# Patient Record
Sex: Female | Born: 1980 | Race: White | Hispanic: No | Marital: Married | Smoking: Never smoker
Health system: Southern US, Community
[De-identification: ages and names within clinical notes are randomized; demographics above are authoritative.]

## PROBLEM LIST (undated history)

## (undated) DIAGNOSIS — E8881 Metabolic syndrome: Secondary | ICD-10-CM

## (undated) DIAGNOSIS — E282 Polycystic ovarian syndrome: Secondary | ICD-10-CM

## (undated) DIAGNOSIS — E88819 Insulin resistance, unspecified: Secondary | ICD-10-CM

## (undated) HISTORY — PX: TONSILLECTOMY: SUR1361

---

## 2017-10-13 ENCOUNTER — Other Ambulatory Visit: Payer: Self-pay

## 2017-10-13 ENCOUNTER — Emergency Department (HOSPITAL_BASED_OUTPATIENT_CLINIC_OR_DEPARTMENT_OTHER)
Admission: EM | Admit: 2017-10-13 | Discharge: 2017-10-13 | Disposition: A | Discharge status: Home / Self Care | Payer: BC Managed Care – PPO | Attending: Emergency Medicine | Admitting: Emergency Medicine

## 2017-10-13 ENCOUNTER — Encounter (HOSPITAL_BASED_OUTPATIENT_CLINIC_OR_DEPARTMENT_OTHER): Payer: Self-pay

## 2017-10-13 ENCOUNTER — Emergency Department (HOSPITAL_BASED_OUTPATIENT_CLINIC_OR_DEPARTMENT_OTHER): Payer: BC Managed Care – PPO

## 2017-10-13 DIAGNOSIS — R142 Eructation: Secondary | ICD-10-CM | POA: Insufficient documentation

## 2017-10-13 DIAGNOSIS — R0789 Other chest pain: Secondary | ICD-10-CM | POA: Diagnosis present

## 2017-10-13 HISTORY — DX: Metabolic syndrome: E88.81

## 2017-10-13 HISTORY — DX: Polycystic ovarian syndrome: E28.2

## 2017-10-13 HISTORY — DX: Insulin resistance, unspecified: E88.819

## 2017-10-13 LAB — COMPREHENSIVE METABOLIC PANEL
ALT: 23 U/L (ref 14–54)
AST: 27 U/L (ref 15–41)
Albumin: 4.2 g/dL (ref 3.5–5.0)
Alkaline Phosphatase: 69 U/L (ref 38–126)
Anion gap: 7 (ref 5–15)
BUN: 15 mg/dL (ref 6–20)
CO2: 21 mmol/L — ABNORMAL LOW (ref 22–32)
Calcium: 9 mg/dL (ref 8.9–10.3)
Chloride: 106 mmol/L (ref 101–111)
Creatinine, Ser: 0.68 mg/dL (ref 0.44–1.00)
GFR calc Af Amer: >60 mL/min (ref >60)
GFR calc non Af Amer: >60 mL/min (ref >60)
Glucose, Bld: 95 mg/dL (ref 65–99)
Potassium: 3.8 mmol/L (ref 3.5–5.1)
Sodium: 134 mmol/L — ABNORMAL LOW (ref 135–145)
Total Bilirubin: 0.3 mg/dL (ref 0.3–1.2)
Total Protein: 8 g/dL (ref 6.5–8.1)

## 2017-10-13 LAB — TROPONIN I: Troponin I: <0.03 ng/mL (ref <0.03)

## 2017-10-13 LAB — D-DIMER, QUANTITATIVE (NOT AT ARMC): D-Dimer, Quant: <0.27 ug/mL-FEU (ref 0.00–0.50)

## 2017-10-13 LAB — CBC
HEMATOCRIT: 32.3 % — AB (ref 36.0–46.0)
Hemoglobin: 10.7 g/dL — ABNORMAL LOW (ref 12.0–15.0)
MCH: 20.7 pg — AB (ref 26.0–34.0)
MCHC: 33.1 g/dL (ref 30.0–36.0)
MCV: 62.6 fL — ABNORMAL LOW (ref 78.0–100.0)
Platelets: 300 10*3/uL (ref 150–400)
RBC: 5.16 MIL/uL — ABNORMAL HIGH (ref 3.87–5.11)
RDW: 17.4 % — ABNORMAL HIGH (ref 11.5–15.5)
WBC: 10.5 10*3/uL (ref 4.0–10.5)

## 2017-10-13 LAB — LIPASE, BLOOD: Lipase: 34 U/L (ref 11–51)

## 2017-10-13 LAB — PREGNANCY, URINE: PREG TEST UR: NEGATIVE

## 2017-10-13 MED ORDER — KETOROLAC TROMETHAMINE 30 MG/ML IJ SOLN
30.0000 mg | Freq: Once | INTRAMUSCULAR | Status: AC
  Administered 2017-10-13: 30 mg via INTRAVENOUS
  Filled 2017-10-13: qty 1

## 2017-10-13 MED ORDER — PANTOPRAZOLE SODIUM 40 MG IV SOLR
40.0000 mg | Freq: Once | INTRAVENOUS | Status: AC
  Administered 2017-10-13: 40 mg via INTRAVENOUS
  Filled 2017-10-13: qty 40

## 2017-10-13 MED ORDER — ONDANSETRON HCL 4 MG PO TABS: 4.0000 mg | ORAL_TABLET | Freq: Four times a day (QID) | ORAL | 0 refills | Status: AC

## 2017-10-13 MED ORDER — SIMETHICONE 40 MG/0.6ML PO SUSP (UNIT DOSE)
40.0000 mg | Freq: Once | ORAL | Status: AC
  Administered 2017-10-13: 40 mg via ORAL
  Filled 2017-10-13: qty 0.6

## 2017-10-13 MED ORDER — GI COCKTAIL ~~LOC~~
30.0000 mL | Freq: Once | ORAL | Status: AC
  Administered 2017-10-13: 30 mL via ORAL
  Filled 2017-10-13: qty 30

## 2017-10-13 MED ORDER — PANTOPRAZOLE SODIUM 20 MG PO TBEC: 20.0000 mg | DELAYED_RELEASE_TABLET | Freq: Every day | ORAL | 0 refills | Status: AC

## 2017-10-13 NOTE — ED Triage Notes (Signed)
C/o CP since 9am-NAD-steady gait

## 2017-10-13 NOTE — ED Notes (Signed)
Pt continues to have frequent belching and reports that her symptoms remain unchanged

## 2017-10-13 NOTE — ED Provider Notes (Signed)
[facility] EMERGENCY DEPARTMENT Provider Note   CSN: [id number] Arrival date & time: 10/13/17  1525     History   Chief Complaint Chief Complaint  Patient presents with  . Chest Pain    HPI Rebekah Mckee is a 37 y.o. female with history of PCOS who presents with a 1 day history of chest pain that began this morning after drinking a protein shake.  Patient reports she stood up and got a sharp pain in the center of her chest rating through to her back.  She reports having left-sided jaw and neck pain over the weekend, but none at this time.  She has had pain rating to her left arm.  She has had associated nausea, but no vomiting.  Her pain is pleuritic.  She has had associated shortness of breath.  It is not exertional.  It is constant.  She reports she has been belching more than usual today.  She reports she used to take medicine for acid reflux in 2008-2009, but nothing recently.  She reports she just had a cough for a week, however this is resolved.  She denies any recent long trips, surgeries, known cancer, exogenous estrogen use, new leg pain or swelling, history of blood clots.  She reports her father died of stage IV colon cancer.  She reports having a negative colonoscopy 10 years ago and was supposed to follow-up 5 years ago, however has not.  She reports having intermittent lower abdominal pain for the past  few years.  She saw her OB/GYN and had a negative workup there, however she has not followed up with a GI specialist. She is currently doing a detox with pills and shakes that she thinks is mostly natural and she has lost 14 pounds in 2 weeks because of it.  HPI  Past Medical History:  Diagnosis Date  . Insulin resistance   . PCOS (polycystic ovarian syndrome)     There are no active problems to display for this patient.   Past Surgical History:  Procedure Laterality Date  . TONSILLECTOMY       OB History   None      Home Medications    Prior to  Admission medications   Medication Sig Start Date End Date Taking? Authorizing Provider  ondansetron (ZOFRAN) 4 MG tablet Take 1 tablet (4 mg total) by mouth every 6 (six) hours. 10/13/17   Wilmont Olund, Waylan Boga, PA-C  pantoprazole (PROTONIX) 20 MG tablet Take 1 tablet (20 mg total) by mouth daily. 10/13/17   Emi Holes, PA-C    Family History No family history on file.  Social History Social History   Tobacco Use  . Smoking status: Never Smoker  . Smokeless tobacco: Never Used  Substance Use Topics  . Alcohol use: Not Currently  . Drug use: Never     Allergies   Patient has no known allergies.   Review of Systems Review of Systems  Constitutional: Positive for diaphoresis. Negative for chills and fever.  HENT: Negative for facial swelling and sore throat.   Respiratory: Positive for shortness of breath.   Cardiovascular: Positive for chest pain.  Gastrointestinal: Positive for abdominal pain and nausea. Negative for constipation, diarrhea and vomiting.  Genitourinary: Negative for dysuria.  Musculoskeletal: Positive for back pain and neck pain.  Skin: Negative for rash and wound.  Neurological: Negative for headaches.  Psychiatric/Behavioral: The patient is not nervous/anxious.      Physical Exam Updated Vital Signs BP 131/84 (BP  Location: Right Arm)   Pulse 62   Temp 97.7 F (36.5 C) (Oral)   Resp 16   Ht 5\' 8"  (1.727 m)   Wt 119 kg (262 lb 5.6 oz)   SpO2 99%   BMI 39.89 kg/m   Physical Exam  Constitutional: She appears well-developed and well-nourished. No distress.  HENT:  Head: Normocephalic and atraumatic.  Mouth/Throat: Oropharynx is clear and moist. No oropharyngeal exudate.  Eyes: Pupils are equal, round, and reactive to light. Conjunctivae are normal. Right eye exhibits no discharge. Left eye exhibits no discharge. No scleral icterus.  Neck: Normal range of motion. Neck supple. No thyromegaly present.  Cardiovascular: Normal rate, regular rhythm,  normal heart sounds and intact distal pulses. Exam reveals no gallop and no friction rub.  No murmur heard. Pulmonary/Chest: Effort normal and breath sounds normal. No stridor. No respiratory distress. She has no wheezes. She has no rales. She exhibits tenderness.  Patient wincing in pain while breathing    Abdominal: Soft. Bowel sounds are normal. She exhibits no distension. There is tenderness in the epigastric area and left upper quadrant. There is no rebound, no guarding, no tenderness at McBurney's point and negative Murphy's sign.  Musculoskeletal: She exhibits no edema.  Lymphadenopathy:    She has no cervical adenopathy.  Neurological: She is alert. Coordination normal.  Skin: Skin is warm and dry. No rash noted. She is not diaphoretic. No pallor.  Psychiatric: She has a normal mood and affect.  Nursing note and vitals reviewed.    ED Treatments / Results  Labs (all labs ordered are listed, but only abnormal results are displayed) Labs Reviewed  CBC - Abnormal; Notable for the following components:      Result Value   RBC 5.16 (*)    Hemoglobin 10.7 (*)    HCT 32.3 (*)    MCV 62.6 (*)    MCH 20.7 (*)    RDW 17.4 (*)    All other components within normal limits  COMPREHENSIVE METABOLIC PANEL - Abnormal; Notable for the following components:   Sodium 134 (*)    CO2 21 (*)    All other components within normal limits  TROPONIN I  PREGNANCY, URINE  LIPASE, BLOOD  D-DIMER, QUANTITATIVE (NOT AT [facility])    EKG EKG Interpretation  Date/Time:  Tuesday October 13 2017 15:36:10 EDT Ventricular Rate:  64 PR Interval:  128 QRS Duration: 100 QT Interval:  414 QTC Calculation: 427 R Axis:   39 Text Interpretation:  Normal sinus rhythm Normal ECG No previous tracing to compare with Confirmed by Meridee ScoreButler, Michael [phone]([id number]) on 10/13/2017 3:40:25 PM   Radiology Dg Chest 2 View  Result Date: 10/13/2017 CLINICAL DATA:  Chest pain EXAM: CHEST - 2 VIEW COMPARISON:  None. FINDINGS: The  heart size and mediastinal contours are within normal limits. Both lungs are clear. The visualized skeletal structures are unremarkable. IMPRESSION: No active cardiopulmonary disease. Electronically Signed   By: Marlan Palauharles  Clark M.D.   On: 10/13/2017 15:54    Procedures Procedures (including critical care time)  Medications Ordered in ED Medications  gi cocktail (Maalox,Lidocaine,Donnatal) (30 mLs Oral Given 10/13/17 1638)  pantoprazole (PROTONIX) injection 40 mg (40 mg Intravenous Given 10/13/17 1819)  ketorolac (TORADOL) 30 MG/ML injection 30 mg (30 mg Intravenous Given 10/13/17 1918)  simethicone (MYLICON) 40 mg/0.606ml suspension 40 mg (40 mg Oral Given 10/13/17 1940)     Initial Impression / Assessment and Plan / ED Course  I have reviewed the triage vital signs  and the nursing notes.  Pertinent labs & imaging results that were available during my care of the patient were reviewed by me and considered in my medical decision making (see chart for details).  Clinical Course as of Oct 15 27  Tue Oct 13, 2017  1620 Patient does have significant pleuritic pain and shortness of breath, which is concerning for PE, will add d-dimer.  I discussed this with the patient and she is agreeable and would like to rule this out.   [AL]  1821 On reevaluation, patient not improved after GI cocktail.  Patient did drive herself here and hopes to drive herself home.  She continues to have belching.  Will give Protonix and reassess.    [AL]  1913 After Protonix, patient reports no significant change in her chest pain, however she has been belching more frequently.  She has had some gas type pain extending to her abdomen.  No focal abdominal tenderness on repeat exam.   [AL]  7316 38 year old female complaining of upper chest discomfort all associated with some belching.  She has been doing a cleansing diet over the past couple weeks and her symptoms acutely happen today after she drank a shake.  She has had problems  with gas pockets and other GI issues in the past.  She is continuing to belch here.  Her cardiac workup including a d-dimer been unremarkable.  Likely this is all GI in nature and she will need to follow-up with her doctor.   [MB]    Clinical Course User Index [AL] Emi Holes, PA-C [MB] Terrilee Files, MD    Patient with chest pain with belching. CBC shows hemoglobin 10.7. 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 She reports some intermittent blood in her stool, but would like to follow up with her doctor for further workup of this. I offered rectal exam with stool card and she declined today. CMP, lipase unremarkable. Troponin and D-dimer negative. No delta trop ordered considering constant pain since 9am this morning. CXR negative. Upreg negative. EKG shows NSR. Patient does feel some improvement after Mylicon and Toradol, but felt no improvement with Protonix or gi cocktail. Patient wanted to drive herself home. Considering family history of colon cancer, patient advised to follow up for her colonoscopy and further evaluation with GI. No focal abdominal pain or tenderness indicating further imaging today. I advised patient may want to stop the detox she is doing as it may be contributing to her symptoms. Will discharge home with protonix and Zofran. Return precautions discussed. Patient understands and agrees with plan. Patient vitals stable throughout ED course and discharged in satisfactory condition. Patient also evaluated by Dr. Charm Barges who guided the patient's management and agrees with plan.  Final Clinical Impressions(s) / ED Diagnoses   Final diagnoses:  Atypical chest pain  Belching    ED Discharge Orders        Ordered    ondansetron (ZOFRAN) 4 MG tablet  Every 6 hours     10/13/17 2011    pantoprazole (PROTONIX) 20 MG tablet  Daily     10/13/17 2011       Undrea Archbold M, [place] 10/14/17 0031    Terrilee Files, MD 10/14/17 1148

## 2017-10-13 NOTE — Discharge Instructions (Addendum)
Medications: Zofran, Protonix  Treatment: Take Zofran every 6 hours as needed for nausea or vomiting. Take Protonix once daily as prescribed.  Follow-up: Please follow-up with your [facility] care provider and [facility], for further evaluation and treatment of your symptoms and colonoscopy. Please return to the emergency department if you develop any new or worsening symptoms.

## 2017-11-21 ENCOUNTER — Emergency Department (HOSPITAL_BASED_OUTPATIENT_CLINIC_OR_DEPARTMENT_OTHER)
Admission: EM | Admit: 2017-11-21 | Discharge: 2017-11-21 | Disposition: A | Discharge status: Home / Self Care | Payer: BC Managed Care – PPO | Attending: Emergency Medicine | Admitting: Emergency Medicine

## 2017-11-21 ENCOUNTER — Encounter (HOSPITAL_BASED_OUTPATIENT_CLINIC_OR_DEPARTMENT_OTHER): Payer: Self-pay | Admitting: Emergency Medicine

## 2017-11-21 ENCOUNTER — Other Ambulatory Visit: Payer: Self-pay

## 2017-11-21 DIAGNOSIS — R21 Rash and other nonspecific skin eruption: Secondary | ICD-10-CM | POA: Diagnosis present

## 2017-11-21 DIAGNOSIS — Z79899 Other long term (current) drug therapy: Secondary | ICD-10-CM | POA: Insufficient documentation

## 2017-11-21 DIAGNOSIS — L509 Urticaria, unspecified: Secondary | ICD-10-CM | POA: Insufficient documentation

## 2017-11-21 LAB — PREGNANCY, URINE: PREG TEST UR: NEGATIVE

## 2017-11-21 MED ORDER — PREDNISONE 20 MG PO TABS: 40.0000 mg | ORAL_TABLET | Freq: Every day | ORAL | 0 refills | Status: AC

## 2017-11-21 MED ORDER — CETIRIZINE HCL 10 MG PO TABS: 10.0000 mg | ORAL_TABLET | Freq: Every day | ORAL | 0 refills | Status: AC

## 2017-11-21 MED ORDER — PREDNISONE 50 MG PO TABS
60.0000 mg | ORAL_TABLET | Freq: Once | ORAL | Status: AC
  Administered 2017-11-21: 09:00:00 60 mg via ORAL
  Filled 2017-11-21: qty 1

## 2017-11-21 NOTE — ED Triage Notes (Signed)
Has had a intermittent rash x 1 week , itchy.

## 2017-11-21 NOTE — ED Provider Notes (Signed)
[facility] EMERGENCY DEPARTMENT Provider Note   CSN: [id number] Arrival date & time: 11/21/17  0753     History   Chief Complaint Chief Complaint  Patient presents with  . Rash    HPI Rebekah Mckee is a 37 y.o. female.  Patient is a 37 year old female with a history of PCOS but takes no medications at this time presenting with 1 week history of fluctuating rash, itchiness that is gradually worsening.  She states last Sunday she went on a drive with her husband and when she got back she broke out in a rash that burns and itches.  She states the rash will come and go and pops up over different areas of her body.  When it is bad her skin whelps up but then goes away on its own.  In the last few days she started to have some intermittent diarrhea and this morning she felt like her throat was itchy and thick.  She cannot think of any new exposures but does have allergies to pollen.  She was looking online and started using Benadryl and Pepcid but it caused her to have a headache and did not improve her symptoms.  She is never had anything like this before.  She also states she has been more tired lately and having some breast tenderness.  She took a pregnancy test but it was negative.  She has irregular periods and last menses was in September.  She denies any history of tick bites in the last month.  Her dogs usefully and take medication and she has not been in a heavily wooded area.  The history is provided by the patient.  Rash   This is a new problem. Episode onset: 1 week ago. The problem has been gradually worsening. The problem is associated with an unknown factor. There has been no fever. Affected Location: diffuse and moves. The pain is at a severity of 0/10. The patient is experiencing no pain. Associated symptoms include itching. Treatments tried: benadryl and pepcid. The treatment provided no relief. Risk factors: for last 2 months using an herbal medication.    Past  Medical History:  Diagnosis Date  . Insulin resistance   . PCOS (polycystic ovarian syndrome)     There are no active problems to display for this patient.   Past Surgical History:  Procedure Laterality Date  . TONSILLECTOMY       OB History   None      Home Medications    Prior to Admission medications   Medication Sig Start Date End Date Taking? Authorizing Provider  ondansetron (ZOFRAN) 4 MG tablet Take 1 tablet (4 mg total) by mouth every 6 (six) hours. 10/13/17  Yes Law, Waylan Boga, PA-C  pantoprazole (PROTONIX) 20 MG tablet Take 1 tablet (20 mg total) by mouth daily. 10/13/17  Yes Law, Waylan Boga, PA-C    Family History No family history on file.  Social History Social History   Tobacco Use  . Smoking status: Never Smoker  . Smokeless tobacco: Never Used  Substance Use Topics  . Alcohol use: Not Currently  . Drug use: Never     Allergies   Patient has no known allergies.   Review of Systems Review of Systems  Skin: Positive for itching and rash.  All other systems reviewed and are negative.    Physical Exam Updated Vital Signs BP 116/81 (BP Location: Left Arm)   Pulse 86   Temp 99.1 F (37.3 C) (Oral)  Resp 16   Ht  (1.727 m)   Wt 118.4 kg (261 lb)   SpO2 98%   BMI 39.68 kg/m   Physical Exam  Constitutional: She is oriented to person, place, and time. She appears well-developed and well-nourished. No distress.  HENT:  Head: Normocephalic and atraumatic.  Mouth/Throat: Oropharynx is clear and moist.  No uvular or tongue edema  Eyes: Pupils are equal, round, and reactive to light. Conjunctivae and EOM are normal.  Neck: Normal range of motion. Neck supple.  No stridor  Cardiovascular: Normal rate, regular rhythm and intact distal pulses.  No murmur heard. Pulmonary/Chest: Effort normal and breath sounds normal. No respiratory distress. She has no wheezes. She has no rales.  Abdominal: Soft. She exhibits no distension. There is no  tenderness. There is no rebound and no guarding.  Musculoskeletal: Normal range of motion. She exhibits no edema or tenderness.  Neurological: She is alert and oriented to person, place, and time.  Skin: Skin is warm and dry. Rash noted. No erythema.  Small patches of a faint macular rash without petechia or vesicles.  Area on her left arm, left upper thigh but very faint and resolving.  Fine macular/papular rash over the face under her eyes  Psychiatric: She has a normal mood and affect. Her behavior is normal.  Nursing note and vitals reviewed.    ED Treatments / Results  Labs (all labs ordered are listed, but only abnormal results are displayed) Labs Reviewed  PREGNANCY, URINE    EKG None  Radiology No results found.  Procedures Procedures (including critical care time)  Medications Ordered in ED Medications - No data to display   Initial Impression / Assessment and Plan / ED Course  I have reviewed the triage vital signs and the nursing notes.  Pertinent labs & imaging results that were available during my care of the patient were reviewed by me and considered in my medical decision making (see chart for details).     Patient presenting with symptoms concerning for urticaria versus an allergic reaction.  Patient is well-appearing here and in no acute distress.  She has been taking a new herbal medication for the last few months but other than that no other medications.  No infectious symptoms such as fever, cough or shortness of breath.  Low suspicion that this is [facility] spotted fever as patient has no neck stiffness.  She will occasionally get a headache but it is not chronic over the last week. UPT is negative.  Will start patient on prednisone and Zyrtec.  Recommended follow-up with an allergist if symptoms do not improve.  Final Clinical Impressions(s) / ED Diagnoses   Final diagnoses:  Urticaria    ED Discharge Orders        Ordered    predniSONE  (DELTASONE) 20 MG tablet  Daily     11/21/17 0855    cetirizine (ZYRTEC ALLERGY) 10 MG tablet  Daily     11/21/17 0855       Gwyneth Sprout, MD 11/21/17 [phone]

## 2018-11-10 IMAGING — CR DG CHEST 2V
2 series · 2 of 2 positions shown · non-contrast
Comparison: None.

CLINICAL DATA: Chest pain

EXAM:
CHEST - 2 VIEW

[w chest pa]
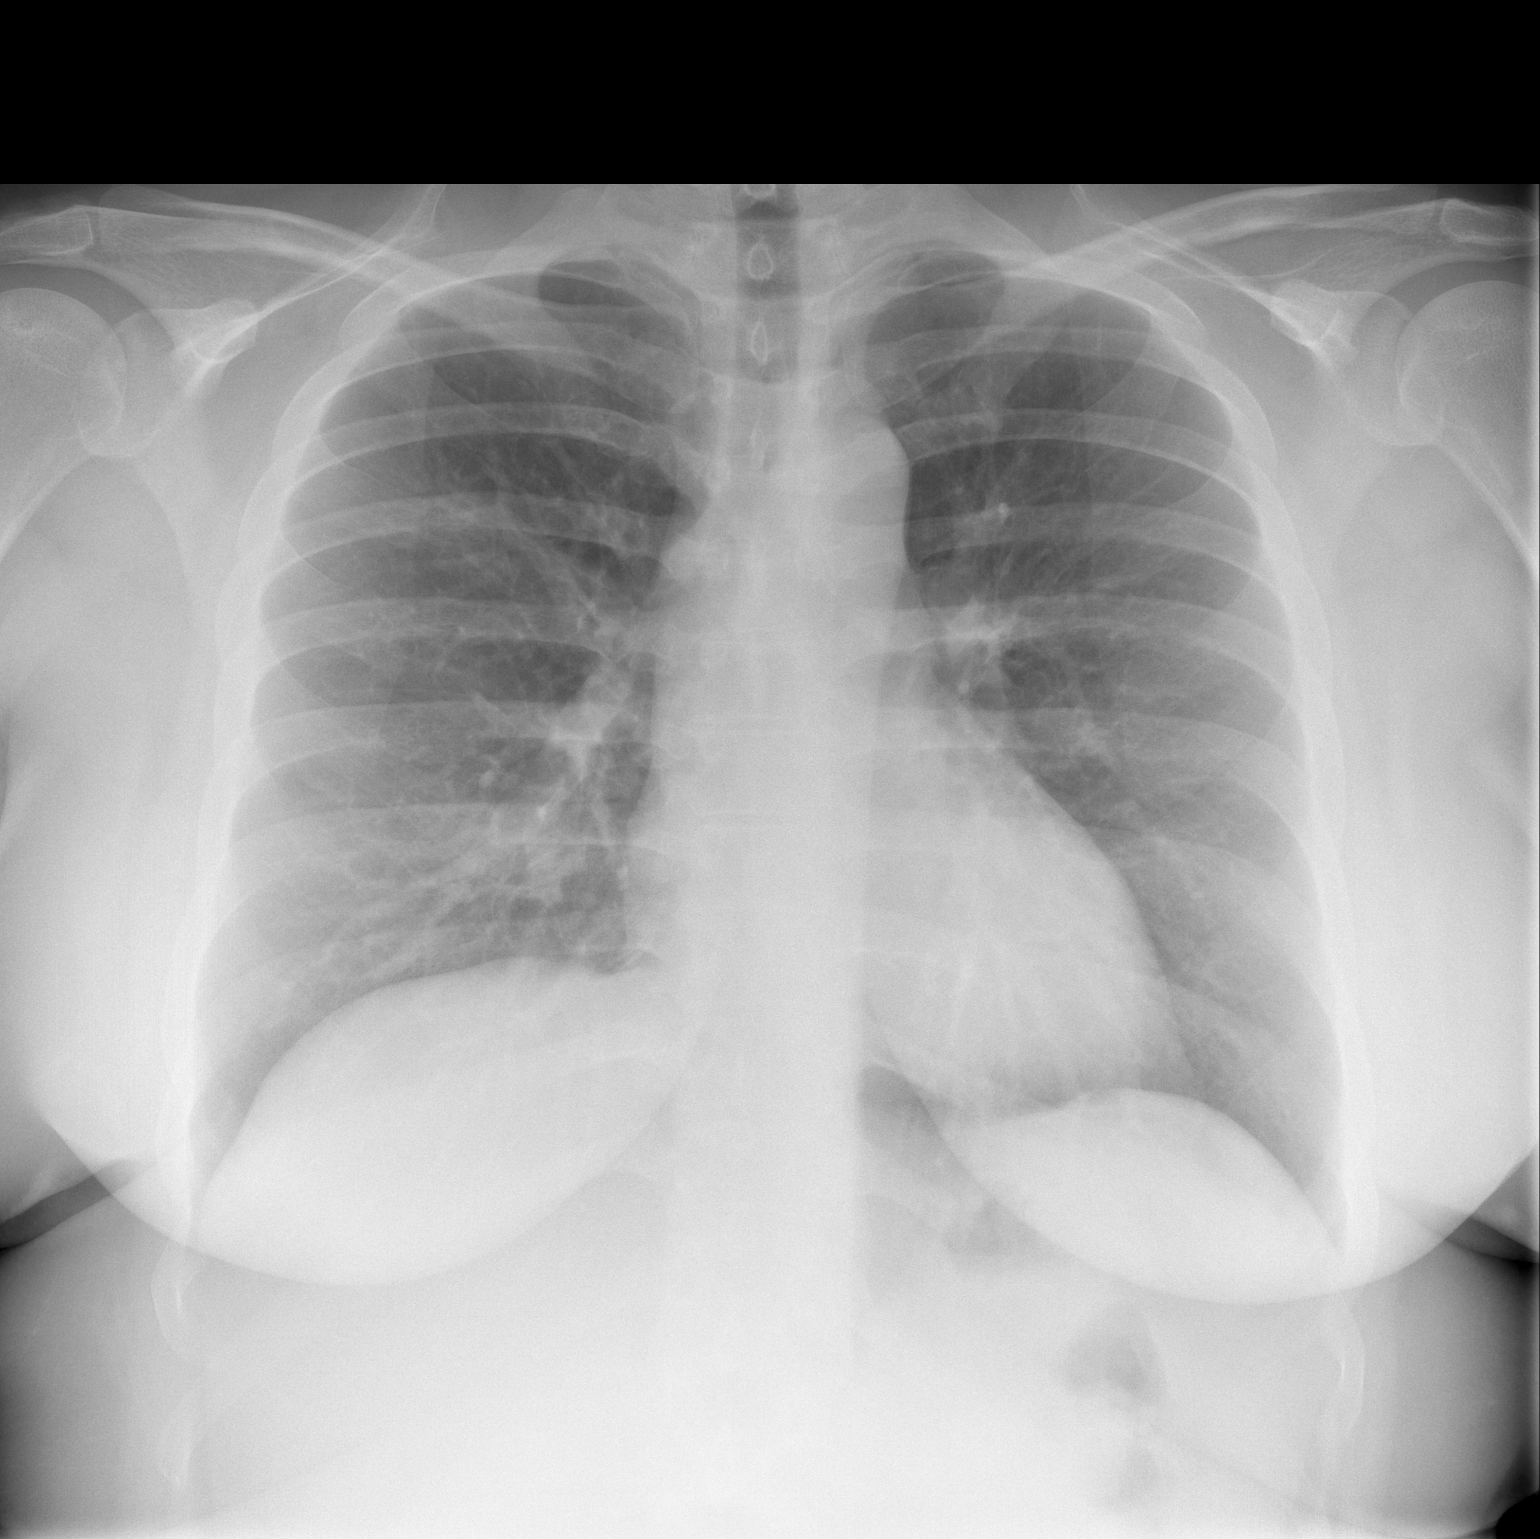

[w chest lat]
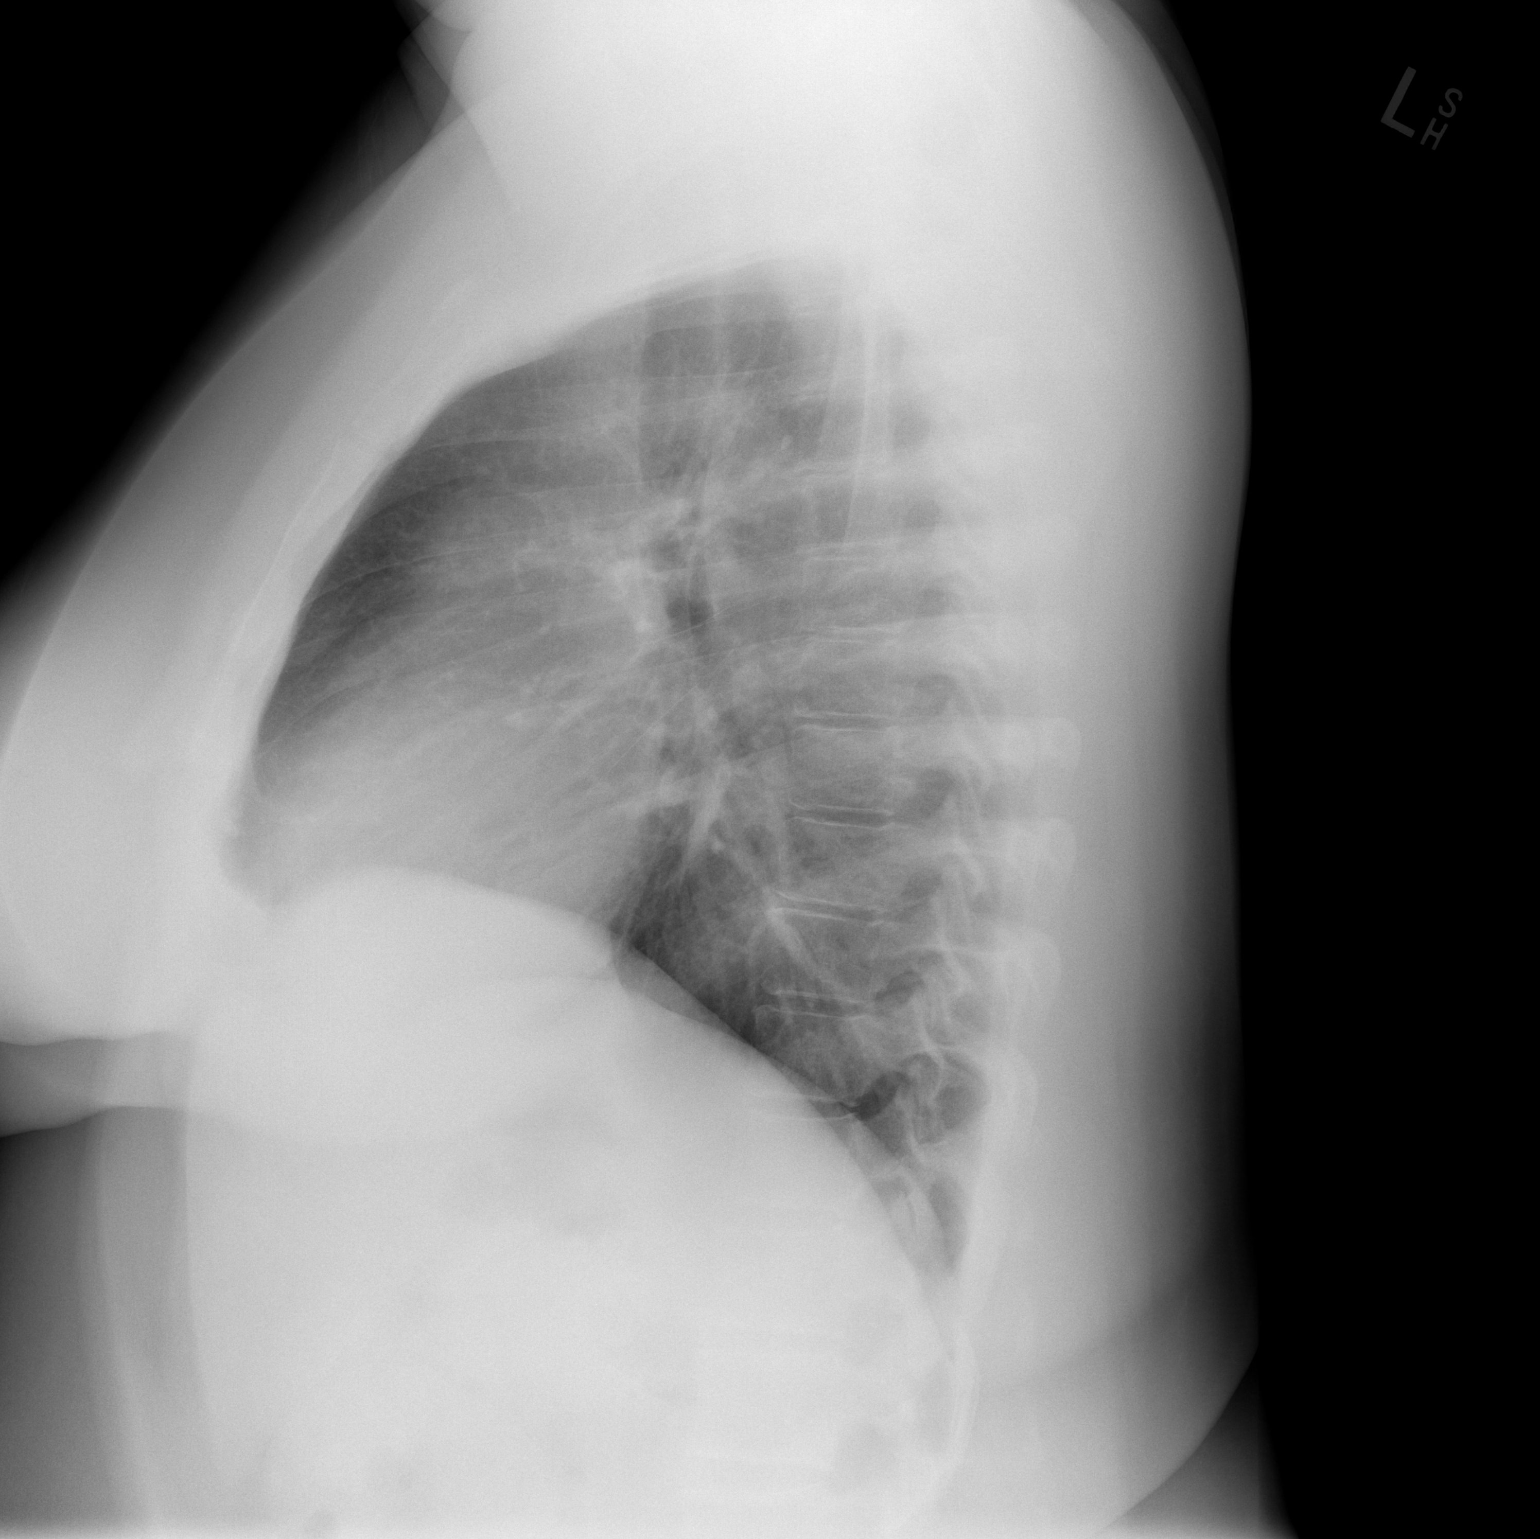

[2 of 2 positions shown; findings below may reference images not displayed]

FINDINGS: The heart size and mediastinal contours are within normal limits.
Both lungs are clear. The visualized skeletal structures are
unremarkable.
IMPRESSION: No active cardiopulmonary disease.
